# Patient Record
Sex: Female | Born: 1995 | Race: White | Hispanic: No | Marital: Single | Smoking: Never smoker
Health system: Southern US, Community
[De-identification: ages and names within clinical notes are randomized; demographics above are authoritative.]

## PROBLEM LIST (undated history)

## (undated) DIAGNOSIS — J45909 Unspecified asthma, uncomplicated: Secondary | ICD-10-CM

## (undated) DIAGNOSIS — F32A Depression, unspecified: Secondary | ICD-10-CM

## (undated) DIAGNOSIS — F419 Anxiety disorder, unspecified: Secondary | ICD-10-CM

## (undated) DIAGNOSIS — F909 Attention-deficit hyperactivity disorder, unspecified type: Secondary | ICD-10-CM

## (undated) DIAGNOSIS — F329 Major depressive disorder, single episode, unspecified: Secondary | ICD-10-CM

## (undated) HISTORY — PX: WISDOM TOOTH EXTRACTION: SHX21

## (undated) HISTORY — DX: Attention-deficit hyperactivity disorder, unspecified type: F90.9

## (undated) HISTORY — PX: NO PAST SURGERIES: SHX2092

---
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
## 2016-03-05 ENCOUNTER — Ambulatory Visit: Payer: Managed Care, Other (non HMO) | Admitting: Psychiatry

## 2016-04-01 ENCOUNTER — Ambulatory Visit: Payer: Managed Care, Other (non HMO) | Admitting: Psychiatry

## 2016-04-07 ENCOUNTER — Encounter: Payer: Self-pay | Admitting: Psychiatry

## 2016-04-07 ENCOUNTER — Ambulatory Visit (INDEPENDENT_AMBULATORY_CARE_PROVIDER_SITE_OTHER): Payer: Managed Care, Other (non HMO) | Admitting: Psychiatry

## 2016-04-07 VITALS — BP 118/75 | HR 111 | Temp 97.9°F | Ht 64.0 in | Wt 190.4 lb

## 2016-04-07 DIAGNOSIS — F900 Attention-deficit hyperactivity disorder, predominantly inattentive type: Secondary | ICD-10-CM

## 2016-04-07 DIAGNOSIS — F330 Major depressive disorder, recurrent, mild: Secondary | ICD-10-CM | POA: Diagnosis not present

## 2016-04-07 MED ORDER — SERTRALINE HCL 50 MG PO TABS: 75.0000 mg | ORAL_TABLET | Freq: Every day | ORAL | 2 refills | Status: DC

## 2016-04-07 MED ORDER — AMPHETAMINE-DEXTROAMPHETAMINE 10 MG PO TABS: 10.0000 mg | ORAL_TABLET | Freq: Every day | ORAL | 0 refills | Status: DC

## 2016-04-07 NOTE — Progress Notes (Signed)
Patient ID: Susan Zuniga, female   DOB: 1996/01/18, 20 y.o.   MRN: [id number]  Psychiatric Progress Note  Patient Identification: Susan Zuniga MRN:  [id number] Date of Evaluation:  04/07/2016 Chief Complaint:  Anxiety, Depression Chief Complaint    Follow-up; Medication Refill     Visit Diagnosis: Anxiety, Depression  History of Present Illness:  Patient is  [age] yo WF seen for follow up of Anxiety and Depression and ADHD. She reported that she is going back to school and will be starting freshman year at the [employer]. She reported that she is feeling anxious. She reported that she was not taking any medication over the summer as she was working and they want her to have a little ADHD as she is more productive. However she wants to start taking her ADD medication. She responded well to Adderall in the past. She reported that she is sleeping only 2 hours at this time. She appeared somewhat apprehensive and anxious during the interview. She currently denied using any drugs or alcohol. She denied having any suicidal homicidal ideations or plans.     Past Medical History:  Past Medical History:  Diagnosis Date  . ADHD (attention deficit hyperactivity disorder)   . Anxiety   . Asthma   . Depression     Past Surgical History:  Procedure Laterality Date  . NO PAST SURGERIES     Family History:  Family History  Problem Relation Age of Onset  . Healthy Mother   . Healthy Father   . Healthy Sister   . Heart disease Other   . Diabetes Other   . Depression Other   . Hypertension Other   . Asthma Other   . Allergies Other    Social History:   Social History   Social History  . Marital status: Single    Spouse name: N/A  . Number of children: N/A  . Years of education: N/A   Occupational History  . Full Time Student     In the 12th Grade  . Work Part Time    Social History Main Topics  . Smoking status: Current Some Day Smoker    Types: Cigarettes    Start date:  11/01/2015  . Smokeless tobacco: Never Used  . Alcohol use No  . Drug use: No     Comment: last used weeks ago  . Sexual activity: No   Other Topics Concern  . None   Social History Narrative  . None   Additional Social History:   Musculoskeletal: Strength & Muscle Tone: within normal limits Gait & Station: normal Patient leans: N/A  Psychiatric Specialty Exam: HPI  ROS  Blood pressure 118/75, pulse (!) 111, temperature 97.9 F (36.6 C), temperature source Oral, height 5\' 4"  (1.626 m), weight 190 lb 6.4 oz (86.4 kg), last menstrual period 03/24/2016.Body mass index is 32.68 kg/m.  General Appearance: Casual  Eye Contact:  Fair  Speech:  normal  Volume:  normal  Mood:  improved  Affect:  smiling  Thought Process:  Coherent  Orientation:  Full (Time, Place, and Person)  Thought Content:  WDL  Suicidal Thoughts:  No  Homicidal Thoughts:  No  Memory:  Immediate;   Fair Recent;   Fair Remote;   Fair  Judgement:  Fair  Insight:  Present  Psychomotor Activity:  normal  Concentration:  Fair  Recall:  [employer]  Fund of Knowledge:Fair  Language: Fair  Akathisia:  No  Handed:  Right  AIMS (if  indicated):  na  Assets:  Communication Skills Desire for Improvement Financial Resources/Insurance Housing Resilience  ADL's:  Intact  Cognition: WNL  Sleep:  good   Is the patient at risk to self?  No. Has the patient been a risk to self in the past 6 months?  No. Has the patient been a risk to self within the distant past?  Yes.   Is the patient a risk to others?  No. Has the patient been a risk to others in the past 6 months?  No. Has the patient been a risk to others within the distant past?  No.  Allergies:  No Known Allergies Current Medications: Current Outpatient Prescriptions  Medication Sig Dispense Refill  . albuterol (PROVENTIL) (2.5 MG/3ML) 0.083% nebulizer solution Take 2.5 mg by nebulization every 6 (six) hours as needed for wheezing or shortness of breath.     . amphetamine-dextroamphetamine (ADDERALL) 10 MG tablet Take 1 tablet (10 mg total) by mouth daily. To be filled 05/07/16 30 tablet 0  . sertraline (ZOLOFT) 50 MG tablet Take 1.5 tablets (75 mg total) by mouth daily. 45 tablet 2   No current facility-administered medications for this visit.     Previous Psychotropic Medications: Yes   Substance Abuse History in the last 12 months:  No.  Consequences of Substance Abuse: Negative  Medical Decision Making:  Established Problem, Stable/Improving (1), Review of Psycho-Social Stressors (1), Review or order clinical lab tests (1), Review and summation of old records (2) and Review of New Medication or Change in Dosage (2)  Treatment Plan Summary: Medication management   Major depressive disorder I will increase Zoloft at 75mg  once daily to help with the depression and anxiety    ADD Continue Adderall at 10mg  po qam. she was given 2 month supply of the medication  Follow-up in 2 months with Dr. Daleen Boavi.    More than 50% of the time spent in psychoeducation, counseling and coordination of care.    This note was generated in part or whole with voice recognition software. Voice regonition is usually quite accurate but there are transcription errors that can and very often do occur. I apologize for any typographical errors that were not detected and corrected.     Brandy HaleUzma Taeko Schaffer, MD 8/14/20178:53 AM
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
## 2016-06-19 ENCOUNTER — Telehealth: Payer: Self-pay

## 2016-06-19 ENCOUNTER — Other Ambulatory Visit: Payer: Self-pay

## 2016-06-19 ENCOUNTER — Other Ambulatory Visit (HOSPITAL_COMMUNITY): Payer: Self-pay | Admitting: Psychiatry

## 2016-06-19 MED ORDER — AMPHETAMINE-DEXTROAMPHETAMINE 10 MG PO TABS: 10.0000 mg | ORAL_TABLET | Freq: Every day | ORAL | 0 refills | Status: DC

## 2016-06-19 NOTE — Telephone Encounter (Signed)
rx ready for pick up.  adderall id [id number] order # [id number]  and the other rx is [id number] order # [id number]

## 2016-06-19 NOTE — Telephone Encounter (Signed)
pt mom states that pt needs a refill on adderall for a couple of months. pt is in collage 3 hours away and she is trying to get set up with someone at school to take over her care but it is a waiting period.  she would love it if you would write for this month and next month.

## 2016-07-16 ENCOUNTER — Ambulatory Visit: Payer: Self-pay | Admitting: Psychiatry

## 2016-07-16 ENCOUNTER — Ambulatory Visit: Payer: Self-pay | Admitting: Physician Assistant

## 2016-07-29 NOTE — Telephone Encounter (Signed)
Opened in error

## 2016-08-07 ENCOUNTER — Encounter: Payer: Self-pay | Admitting: Psychiatry

## 2016-08-07 ENCOUNTER — Ambulatory Visit (INDEPENDENT_AMBULATORY_CARE_PROVIDER_SITE_OTHER): Payer: Managed Care, Other (non HMO) | Admitting: Psychiatry

## 2016-08-07 VITALS — BP 120/72 | HR 104 | Temp 98.4°F | Wt 195.4 lb

## 2016-08-07 DIAGNOSIS — F900 Attention-deficit hyperactivity disorder, predominantly inattentive type: Secondary | ICD-10-CM

## 2016-08-07 DIAGNOSIS — F411 Generalized anxiety disorder: Secondary | ICD-10-CM | POA: Diagnosis not present

## 2016-08-07 MED ORDER — AMPHETAMINE-DEXTROAMPHETAMINE 10 MG PO TABS: 10.0000 mg | ORAL_TABLET | Freq: Every day | ORAL | 0 refills | Status: DC

## 2016-08-07 MED ORDER — SERTRALINE HCL 25 MG PO TABS: 25.0000 mg | ORAL_TABLET | Freq: Every day | ORAL | 1 refills | Status: DC

## 2016-08-07 NOTE — Progress Notes (Signed)
Patient ID: Susan Zuniga, female   DOB: 1996/01/25, 20 y.o.   MRN: [id number]  Psychiatric Progress Note  Patient Identification: Susan Zuniga MRN:  [id number] Date of Evaluation:  08/07/2016 Chief Complaint:  Anxiety, Depression Chief Complaint    Follow-up; Medication Refill     Visit Diagnosis: Anxiety, Depression  History of Present Illness:  Patient is  [age] yo WF seen for follow up of Anxiety and Depression and ADHD. Patient seen after 4 months, states being in school made it difficult for her to come. She has not been taking the zoloft for 2 months. States she ran out. She has been taking the adderall. States having some anxiety recently. She reports doing well at school. States that she is making good grades and is in the band and also works pretty much everyday. Denies any suicidal thoughts.   Past Medical History:  Past Medical History:  Diagnosis Date  . ADHD (attention deficit hyperactivity disorder)   . Anxiety   . Asthma   . Depression     Past Surgical History:  Procedure Laterality Date  . NO PAST SURGERIES     Family History:  Family History  Problem Relation Age of Onset  . Healthy Mother   . Healthy Father   . Healthy Sister   . Heart disease Other   . Diabetes Other   . Depression Other   . Hypertension Other   . Asthma Other   . Allergies Other    Social History:   Social History   Social History  . Marital status: Single    Spouse name: N/A  . Number of children: N/A  . Years of education: N/A   Occupational History  . Full Time Student     In the 12th Grade  . Work Part Time    Social History Main Topics  . Smoking status: Current Some Day Smoker    Types: Cigarettes    Start date: 11/01/2015  . Smokeless tobacco: Never Used  . Alcohol use No  . Drug use: No     Comment: last used about a month ago  . Sexual activity: No   Other Topics Concern  . None   Social History Narrative  . None   Additional Social History:    Musculoskeletal: Strength & Muscle Tone: within normal limits Gait & Station: normal Patient leans: N/A  Psychiatric Specialty Exam: Medication Refill     ROS  Blood pressure 120/72, pulse (!) 104, temperature 98.4 F (36.9 C), temperature source Oral, weight 195 lb 6.4 oz (88.6 kg).Body mass index is 33.54 kg/m.  General Appearance: Casual  Eye Contact:  Fair  Speech:  normal  Volume:  normal  Mood:  fair  Affect:  smiling  Thought Process:  Coherent  Orientation:  Full (Time, Place, and Person)  Thought Content:  WDL  Suicidal Thoughts:  No  Homicidal Thoughts:  No  Memory:  Immediate;   Fair Recent;   Fair Remote;   Fair  Judgement:  Fair  Insight:  Present  Psychomotor Activity:  normal  Concentration:  Fair  Recall:  F[employer]Fair  Fund of Knowledge:Fair  Language: Fair  Akathisia:  No  Handed:  Right  AIMS (if indicated):  na  Assets:  Communication Skills Desire for Improvement Financial Resources/Insurance Housing Resilience  ADL's:  Intact  Cognition: WNL  Sleep:  good   Is the patient at risk to self?  No. Has the patient been a risk to self in the  past 6 months?  No. Has the patient been a risk to self within the distant past?  Yes.   Is the patient a risk to others?  No. Has the patient been a risk to others in the past 6 months?  No. Has the patient been a risk to others within the distant past?  No.  Allergies:  No Known Allergies Current Medications: Current Outpatient Prescriptions  Medication Sig Dispense Refill  . albuterol (PROVENTIL) (2.5 MG/3ML) 0.083% nebulizer solution Take 2.5 mg by nebulization every 6 (six) hours as needed for wheezing or shortness of breath.    . amphetamine-dextroamphetamine (ADDERALL) 10 MG tablet Take 1 tablet (10 mg total) by mouth daily. To be filled 30 tablet 0  . sertraline (ZOLOFT) 50 MG tablet Take 1.5 tablets (75 mg total) by mouth daily. 45 tablet 2   No current facility-administered medications for this  visit.     Previous Psychotropic Medications: Yes   Substance Abuse History in the last 12 months:  No.  Consequences of Substance Abuse: Negative  Medical Decision Making:  Established Problem, Stable/Improving (1), Review of Psycho-Social Stressors (1), Review or order clinical lab tests (1), Review and summation of old records (2) and Review of New Medication or Change in Dosage (2)  Treatment Plan Summary: Medication management   Major depressive disorder Restart zoloft at 25mg  po qd. patient educated about not stopping the Zoloft suddenly.  ADD Continue Adderall at 10mg  po qam. she was given 2 month supply of the medication  Follow-up in 2 months or call before with the questions   More than 50% of the time spent in psychoeducation, counseling and coordination of care.    This note was generated in part or whole with voice recognition software. Voice regonition is usually quite accurate but there are transcription errors that can and very often do occur. I apologize for any typographical errors that were not detected and corrected.     Patrick NorthAVI, Wilfred Siverson, MD 12/14/20171:33 PM

## 2017-03-10 ENCOUNTER — Encounter: Payer: Self-pay | Admitting: Physician Assistant

## 2017-03-10 ENCOUNTER — Ambulatory Visit (INDEPENDENT_AMBULATORY_CARE_PROVIDER_SITE_OTHER): Payer: Managed Care, Other (non HMO) | Admitting: Physician Assistant

## 2017-03-10 VITALS — BP 110/64 | HR 110 | Resp 16 | Wt 182.0 lb

## 2017-03-10 DIAGNOSIS — Z3009 Encounter for other general counseling and advice on contraception: Secondary | ICD-10-CM | POA: Diagnosis not present

## 2017-03-10 NOTE — Progress Notes (Signed)
       Patient: Susan Zuniga Female    DOB: 1996/07/14   21 y.o.   MRN: [id number] Visit Date: 03/10/2017  Today's Provider: Trey SailorsAdriana M Pollak, PA-C   Chief Complaint  Patient presents with  . Contraception   Subjective:    HPI  Susan Zuniga is a 21 y/o woman presenting today to talk about birth control options. She has been on Jolivette and Sprintec in the past, but desires something that is not pill form because she forgets to take pills. She is not currently sexually active but thinks she may be in several months. Not planning on having children for the next 5-7 years. She does not smoke, no history of DVTs, stroke, heart attack. Would also like birth control that causes her to lose her period.    No Known Allergies   Current Outpatient Prescriptions:  .  amphetamine-dextroamphetamine (ADDERALL) 20 MG tablet, Take 20 mg by mouth 3 (three) times daily., Disp: , Rfl:  .  fexofenadine (ALLEGRA) 180 MG tablet, Take 180 mg by mouth daily., Disp: , Rfl:   Review of Systems  Constitutional: Negative.   Respiratory: Negative.   Cardiovascular: Negative.   Gastrointestinal: Negative.   Genitourinary: Negative.   Neurological: Negative for dizziness, light-headedness and headaches.    Social History  Substance Use Topics  . Smoking status: Former Smoker    Types: Cigarettes    Start date: 11/01/2015  . Smokeless tobacco: Never Used  . Alcohol use No   Objective:   LMP 03/02/2017  There were no vitals filed for this visit.   Physical Exam  Constitutional: She is oriented to person, place, and time. She appears well-developed and well-nourished.  Cardiovascular: Normal rate.   Pulmonary/Chest: Effort normal.  Abdominal: Soft.  Neurological: She is alert and oriented to person, place, and time.  Skin: Skin is warm and dry.  Psychiatric: She has a normal mood and affect. Her behavior is normal.        Assessment & Plan:     1. Encounter for counseling regarding  contraception  We have talked about different types of contraception today. Patient desires non pill forms and one that would cause her to lose her period. Discussed this would leave us with Depo shot, IUD, or Nexplanon. Have counseled patient that all of these have risks with break through bleeding. Have talked about Depo being one shot Q3 months, risk of weight gain and bone mineral density depletion. Also discussed IUD and Nexplanon, risks associated with both. Patient thinks she would like to pursue Nexplanon. Will refer to gynecology.  - Ambulatory referral to Obstetrics / Gynecology  I have spent 15 minutes with this patient >50% of which was spent on counseling and coordination of care.  Return if symptoms worsen or fail to improve.        Trey SailorsAdriana M Pollak, PA-C  [facility]

## 2017-03-10 NOTE — Patient Instructions (Signed)
Contraception Choices Contraception (birth control) is the use of any methods or devices to prevent pregnancy. Below are some methods to help avoid pregnancy. Hormonal methods  Contraceptive implant. This is a thin, plastic tube containing progesterone hormone. It does not contain estrogen hormone. Your health care provider inserts the tube in the inner part of the upper arm. The tube can remain in place for up to 3 years. After 3 years, the implant must be removed. The implant prevents the ovaries from releasing an egg (ovulation), thickens the cervical mucus to prevent sperm from entering the uterus, and thins the lining of the inside of the uterus.  Progesterone-only injections. These injections are given every 3 months by your health care provider to prevent pregnancy. This synthetic progesterone hormone stops the ovaries from releasing eggs. It also thickens cervical mucus and changes the uterine lining. This makes it harder for sperm to survive in the uterus.  Birth control pills. These pills contain estrogen and progesterone hormone. They work by preventing the ovaries from releasing eggs (ovulation). They also cause the cervical mucus to thicken, preventing the sperm from entering the uterus. Birth control pills are prescribed by a health care provider.Birth control pills can also be used to treat heavy periods.  Minipill. This type of birth control pill contains only the progesterone hormone. They are taken every day of each month and must be prescribed by your health care provider.  Birth control patch. The patch contains hormones similar to those in birth control pills. It must be changed once a week and is prescribed by a health care provider.  Vaginal ring. The ring contains hormones similar to those in birth control pills. It is left in the vagina for 3 weeks, removed for 1 week, and then a new one is put back in place. The patient must be comfortable inserting and removing the ring from  the vagina.A health care provider's prescription is necessary.  Emergency contraception. Emergency contraceptives prevent pregnancy after unprotected sexual intercourse. This pill can be taken right after sex or up to 5 days after unprotected sex. It is most effective the sooner you take the pills after having sexual intercourse. Most emergency contraceptive pills are available without a prescription. Check with your pharmacist. Do not use emergency contraception as your only form of birth control. Barrier methods  Female condom. This is a thin sheath (latex or rubber) that is worn over the penis during sexual intercourse. It can be used with spermicide to increase effectiveness.  Female condom. This is a soft, loose-fitting sheath that is put into the vagina before sexual intercourse.  Diaphragm. This is a soft, latex, dome-shaped barrier that must be fitted by a health care provider. It is inserted into the vagina, along with a spermicidal jelly. It is inserted before intercourse. The diaphragm should be left in the vagina for 6 to 8 hours after intercourse.  Cervical cap. This is a round, soft, latex or plastic cup that fits over the cervix and must be fitted by a health care provider. The cap can be left in place for up to 48 hours after intercourse.  Sponge. This is a soft, circular piece of polyurethane foam. The sponge has spermicide in it. It is inserted into the vagina after wetting it and before sexual intercourse.  Spermicides. These are chemicals that kill or block sperm from entering the cervix and uterus. They come in the form of creams, jellies, suppositories, foam, or tablets. They do not require a prescription. They   are inserted into the vagina with an applicator before having sexual intercourse. The process must be repeated every time you have sexual intercourse. Intrauterine contraception  Intrauterine device (IUD). This is a T-shaped device that is put in a woman's uterus during  a menstrual period to prevent pregnancy. There are 2 types: ? Copper IUD. This type of IUD is wrapped in copper wire and is placed inside the uterus. Copper makes the uterus and fallopian tubes produce a fluid that kills sperm. It can stay in place for 10 years. ? Hormone IUD. This type of IUD contains the hormone progestin (synthetic progesterone). The hormone thickens the cervical mucus and prevents sperm from entering the uterus, and it also thins the uterine lining to prevent implantation of a fertilized egg. The hormone can weaken or kill the sperm that get into the uterus. It can stay in place for 3-5 years, depending on which type of IUD is used. Permanent methods of contraception  Female tubal ligation. This is when the woman's fallopian tubes are surgically sealed, tied, or blocked to prevent the egg from traveling to the uterus.  Hysteroscopic sterilization. This involves placing a small coil or insert into each fallopian tube. Your doctor uses a technique called hysteroscopy to do the procedure. The device causes scar tissue to form. This results in permanent blockage of the fallopian tubes, so the sperm cannot fertilize the egg. It takes about 3 months after the procedure for the tubes to become blocked. You must use another form of birth control for these 3 months.  Female sterilization. This is when the female has the tubes that carry sperm tied off (vasectomy).This blocks sperm from entering the vagina during sexual intercourse. After the procedure, the man can still ejaculate fluid (semen). Natural planning methods  Natural family planning. This is not having sexual intercourse or using a barrier method (condom, diaphragm, cervical cap) on days the woman could become pregnant.  Calendar method. This is keeping track of the length of each menstrual cycle and identifying when you are fertile.  Ovulation method. This is avoiding sexual intercourse during ovulation.  Symptothermal method.  This is avoiding sexual intercourse during ovulation, using a thermometer and ovulation symptoms.  Post-ovulation method. This is timing sexual intercourse after you have ovulated. Regardless of which type or method of contraception you choose, it is important that you use condoms to protect against the transmission of sexually transmitted infections (STIs). Talk with your health care provider about which form of contraception is most appropriate for you. This information is not intended to replace advice given to you by your health care provider. Make sure you discuss any questions you have with your health care provider. Document Released: 08/11/2005 Document Revised: 01/17/2016 Document Reviewed: 02/03/2013 Elsevier Interactive Patient Education  2017 Elsevier Inc[employer]  

## 2017-03-12 ENCOUNTER — Telehealth: Payer: Self-pay | Admitting: Obstetrics & Gynecology

## 2017-03-12 NOTE — Telephone Encounter (Signed)
Pt is being referred by [facility] for counseling regarding contraception. Left voicemail for patient to call back to be schedule

## 2017-03-17 NOTE — Telephone Encounter (Signed)
Pt is schedule 03/19/17 °

## 2017-03-19 ENCOUNTER — Encounter: Payer: Self-pay | Admitting: Obstetrics and Gynecology

## 2017-03-19 ENCOUNTER — Ambulatory Visit (INDEPENDENT_AMBULATORY_CARE_PROVIDER_SITE_OTHER): Payer: Managed Care, Other (non HMO) | Admitting: Obstetrics and Gynecology

## 2017-03-19 VITALS — BP 108/66 | HR 124 | Ht 65.0 in | Wt 180.0 lb

## 2017-03-19 DIAGNOSIS — Z3009 Encounter for other general counseling and advice on contraception: Secondary | ICD-10-CM

## 2017-03-19 DIAGNOSIS — Z30017 Encounter for initial prescription of implantable subdermal contraceptive: Secondary | ICD-10-CM | POA: Diagnosis not present

## 2017-03-19 MED ORDER — ETONOGESTREL 68 MG ~~LOC~~ IMPL: 68.0000 mg | DRUG_IMPLANT | Freq: Once | SUBCUTANEOUS | Status: AC

## 2017-03-19 NOTE — Progress Notes (Signed)
Chief Complaint  Patient presents with  . Contraception    Referred by [facility]    HPI:      [name] is a 21 y.o. G0P0000 who LMP was Patient's last menstrual period was 03/02/2017., [facility], referred by [facility]. Pt did OCPs in the past and forgot to take them regularly. She is interested in a non-daily BC that will also help with the flow of her periods. 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 Menses are monthly, lasting 7 days, med flow with dysmen, somewhat improved with NSAIDs.  She hasn't been sex active since 11/18. No hx of HTN, clotting disorder, lupus.    Past Medical History:  Diagnosis Date  . ADHD (attention deficit hyperactivity disorder)   . Anxiety   . Asthma   . Depression     Past Surgical History:  Procedure Laterality Date  . NO PAST SURGERIES      Family History  Problem Relation Age of Onset  . Healthy Mother   . Healthy Father   . Healthy Sister   . Heart disease Other   . Diabetes Other   . Depression Other   . Hypertension Other   . Asthma Other   . Allergies Other     Social History   Social History  . Marital status: Single    Spouse name: N/A  . Number of children: N/A  . Years of education: N/A   Occupational History  . Full Time Student     In the 12th Grade  . Work Part Time    Social History Main Topics  . Smoking status: Never Smoker  . Smokeless tobacco: Never Used  . Alcohol use No  . Drug use: No     Comment: last used about a month ago  . Sexual activity: No   Other Topics Concern  . Not on file   Social History Narrative  . No narrative on file     Current Outpatient Prescriptions:  .  amphetamine-dextroamphetamine (ADDERALL) 20 MG tablet, Take 20 mg by mouth 3 (three) times daily., Disp: , Rfl:  .  fexofenadine (ALLEGRA) 180 MG tablet, Take 180 mg by mouth daily., Disp: , Rfl:    ROS:  Review of Systems  Constitutional: Negative for fever.    Gastrointestinal: Negative for blood in stool, constipation, diarrhea, nausea and vomiting.  Genitourinary: Negative for dyspareunia, dysuria, flank pain, frequency, hematuria, urgency, vaginal bleeding, vaginal discharge and vaginal pain.  Musculoskeletal: Negative for back pain.  Skin: Negative for rash.     OBJECTIVE:   Vitals:  BP 108/66 (BP Location: Left Arm, Patient Position: Sitting, Cuff Size: Normal)   Pulse (!) 124   Ht 5\' 5"  (1.651 m)   Wt 180 lb (81.6 kg)   LMP 03/02/2017   BMI 29.95 kg/m   Physical Exam  Constitutional: She is oriented to person, place, and time and well-developed, well-nourished, and in no distress.  Neurological: She is alert and oriented to person, place, and time.  Psychiatric: Mood, memory, affect and judgment normal.  Vitals reviewed.    Assessment/Plan: Encounter for initial prescription of implantable subdermal contraceptive - All BC options/pros and cons discussed. Pt elects to try nexplanon. Placed today. F/u prn.      Nexplanon Insertion  Patient given informed consent, signed copy in the chart, time out was performed.  Appropriate time out taken.  Patient's LEFT arm was prepped and  draped in the usual sterile fashion. The ruler used to measure and mark insertion area.  Pt was prepped with betadine swab and then injected with 1.0 cc of 2% lidocaine with epinephrine. Nexplanon removed form packaging,  Device confirmed in needle, then inserted full length of needle and withdrawn per handbook instructions.  Pt insertion site covered with steri-strip and a bandage.   Minimal blood loss.  Pt tolerated the procedure welL.  Assessment: Encounter for initial prescription of implantable subdermal contraceptive - All BC options/pros and cons discussed. Pt elects to try nexplanon. Placed today. F/u prn.    Plan:   She was told to remove the dressing in 12-24 hours, to keep the incision area dry for 24 hours and to remove the Steristrip in 2-3   days.  Notify us if any signs of tenderness, redness, pain, or fevers develop.   Alicia B. Copland, PA-C 03/19/2017 2:41 PM

## 2017-03-19 NOTE — Patient Instructions (Signed)
Remove the dressing in 12-24 hours,  keep the incision area dry for 24 hours and remove the Steristrip in 2-3  days.  Notify us if any signs of tenderness, redness, pain, or fevers develop.  

## 2017-10-21 ENCOUNTER — Encounter: Payer: Self-pay | Admitting: Gynecology

## 2017-10-21 ENCOUNTER — Other Ambulatory Visit: Payer: Self-pay

## 2017-10-21 ENCOUNTER — Ambulatory Visit
Admission: EM | Admit: 2017-10-21 | Discharge: 2017-10-21 | Disposition: A | Discharge status: Home / Self Care | Payer: Managed Care, Other (non HMO) | Attending: Family Medicine | Admitting: Family Medicine

## 2017-10-21 DIAGNOSIS — J0100 Acute maxillary sinusitis, unspecified: Secondary | ICD-10-CM

## 2017-10-21 DIAGNOSIS — H6503 Acute serous otitis media, bilateral: Secondary | ICD-10-CM

## 2017-10-21 MED ORDER — AMOXICILLIN 875 MG PO TABS: 875.0000 mg | ORAL_TABLET | Freq: Two times a day (BID) | ORAL | 0 refills | Status: DC

## 2017-10-21 NOTE — ED Triage Notes (Signed)
Patient c/o cough x6 days.

## 2017-10-21 NOTE — ED Provider Notes (Signed)
[facility]    CSN: [id number] Arrival date & time: 10/21/17  1057     History   Chief Complaint Chief Complaint  Patient presents with  . Cough    HPI Susan Zuniga is a 22 y.o. female.   The history is provided by the patient.  Cough  Associated symptoms: headaches   Associated symptoms: no wheezing   URI  Presenting symptoms: cough, facial pain and fatigue   Severity:  Moderate Onset quality:  Sudden Duration:  1 week Timing:  Constant Progression:  Worsening Chronicity:  New Relieved by:  Nothing Ineffective treatments:  OTC medications Associated symptoms: headaches and sinus pain   Associated symptoms: no wheezing   Risk factors: sick contacts   Risk factors: not elderly, no chronic cardiac disease, no chronic kidney disease, no chronic respiratory disease, no diabetes mellitus, no immunosuppression, no recent illness and no recent travel     Past Medical History:  Diagnosis Date  . ADHD (attention deficit hyperactivity disorder)   . Anxiety   . Asthma   . Depression     Patient Active Problem List   Diagnosis Date Noted  . Anxiety 09/13/2015  . Clinical depression 09/13/2015  . Poor concentration 09/13/2015  . Acne 08/02/2015  . Allergic rhinitis 08/02/2015  . 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 Family planning 08/02/2015  . Asthma, mild persistent 08/02/2015    Past Surgical History:  Procedure Laterality Date  . NO PAST SURGERIES    . WISDOM TOOTH EXTRACTION      OB History    Gravida Para Term Preterm AB Living   0 0 0 0 0 0   SAB TAB Ectopic Multiple Live Births   0 0 0 0 0       Home Medications    Prior to Admission medications   Medication Sig Start Date End Date Taking? Authorizing Provider  amphetamine-dextroamphetamine (ADDERALL) 20 MG tablet Take 20 mg by mouth 3 (three) times daily.   Yes [provider]  escitalopram (LEXAPRO) 10 MG tablet Take 10 mg by mouth daily.   Yes [provider]    fexofenadine (ALLEGRA) 180 MG tablet Take 180 mg by mouth daily.   Yes [provider]  prazosin (MINIPRESS) 1 MG capsule Take 1 mg by mouth at bedtime.   Yes [provider]  amoxicillin (AMOXIL) 875 MG tablet Take 1 tablet (875 mg total) by mouth 2 (two) times daily. 10/21/17   Payton Mccallum, MD    Family History Family History  Problem Relation Age of Onset  . Healthy Mother   . Healthy Father   . Healthy Sister   . Heart disease Other   . Diabetes Other   . Depression Other   . Hypertension Other   . Asthma Other   . Allergies Other     Social History Social History   Tobacco Use  . Smoking status: Never Smoker  . Smokeless tobacco: Never Used  Substance Use Topics  . Alcohol use: No    Alcohol/week: 0.0 oz  . Drug use: No    Comment: last used about a month ago     Allergies   Patient has no known allergies.   Review of Systems Review of Systems  Constitutional: Positive for fatigue.  HENT: Positive for sinus pain.   Respiratory: Positive for cough. Negative for wheezing.   Neurological: Positive for headaches.     Physical Exam Triage Vital Signs ED Triage Vitals  Enc Vitals Group  BP 10/21/17 1126 120/81     Pulse Rate 10/21/17 1126 100     Resp 10/21/17 1126 16     Temp 10/21/17 1126 98.6 F (37 C)     Temp Source 10/21/17 1126 Oral     SpO2 10/21/17 1126 98 %     Weight 10/21/17 1125 180 lb (81.6 kg)     Height 10/21/17 1125 5\' 5"  (1.651 m)     Head Circumference --      Peak Flow --      Pain Score 10/21/17 1125 2     Pain Loc --      Pain Edu? --      Excl. in GC? --    No data found.  Updated Vital Signs BP 120/81 (BP Location: Left Arm)   Pulse 100   Temp 98.6 F (37 C) (Oral)   Resp 16   Ht 5\' 5"  (1.651 m)   Wt 180 lb (81.6 kg)   LMP 10/07/2017   SpO2 98%   BMI 29.95 kg/m   Visual Acuity Right Eye Distance:   Left Eye Distance:   Bilateral Distance:    Right Eye Near:   Left Eye Near:     Bilateral Near:     Physical Exam  Constitutional: She appears well-developed and well-nourished. No distress.  HENT:  Head: Normocephalic and atraumatic.  Right Ear: External ear and ear canal normal. Tympanic membrane is erythematous. A middle ear effusion is present.  Left Ear: External ear and ear canal normal. Tympanic membrane is erythematous. A middle ear effusion is present.  Nose: Mucosal edema and rhinorrhea present. No nose lacerations, sinus tenderness, nasal deformity, septal deviation or nasal septal hematoma. No epistaxis.  No foreign bodies. Right sinus exhibits maxillary sinus tenderness and frontal sinus tenderness. Left sinus exhibits maxillary sinus tenderness and frontal sinus tenderness.  Mouth/Throat: Uvula is midline, oropharynx is clear and moist and mucous membranes are normal. No oropharyngeal exudate.  Eyes: Conjunctivae and EOM are normal. Pupils are equal, round, and reactive to light. Right eye exhibits no discharge. Left eye exhibits no discharge. No scleral icterus.  Neck: Normal range of motion. Neck supple. No thyromegaly present.  Cardiovascular: Normal rate, regular rhythm and normal heart sounds.  Pulmonary/Chest: Effort normal and breath sounds normal. No respiratory distress. She has no wheezes. She has no rales.  Lymphadenopathy:    She has no cervical adenopathy.  Skin: She is not diaphoretic.  Nursing note and vitals reviewed.    UC Treatments / Results  Labs (all labs ordered are listed, but only abnormal results are displayed) Labs Reviewed - No data to display  EKG  EKG Interpretation None       Radiology No results found.  Procedures Procedures (including critical care time)  Medications Ordered in UC Medications - No data to display   Initial Impression / Assessment and Plan / UC Course  I have reviewed the triage vital signs and the nursing notes.  Pertinent labs & imaging results that were available during my care of  the patient were reviewed by me and considered in my medical decision making (see chart for details).      Final Clinical Impressions(s) / UC Diagnoses   Final diagnoses:  Acute maxillary sinusitis, recurrence not specified  Bilateral acute serous otitis media, recurrence not specified    ED Discharge Orders        Ordered    amoxicillin (AMOXIL) 875 MG tablet  2 times daily  10/21/17 1213     1. diagnosis reviewed with patient 2. rx as per orders above; reviewed possible side effects, interactions, risks and benefits  3. Recommend supportive treatment with otc flonase 4. Follow-up prn if symptoms worsen or don't improve  Controlled Substance Prescriptions [place] Controlled Substance Registry consulted? Not Applicable   Payton Mccallum, MD 10/21/17 [phone]

## 2020-02-09 ENCOUNTER — Encounter: Payer: Self-pay | Admitting: Family Medicine

## 2020-02-09 ENCOUNTER — Other Ambulatory Visit: Payer: Self-pay

## 2020-02-09 ENCOUNTER — Ambulatory Visit: Payer: Managed Care, Other (non HMO) | Admitting: Family Medicine

## 2020-02-09 VITALS — BP 120/78 | HR 80 | Ht 65.0 in | Wt 214.0 lb

## 2020-02-09 DIAGNOSIS — Z7689 Persons encountering health services in other specified circumstances: Secondary | ICD-10-CM

## 2020-02-09 DIAGNOSIS — N63 Unspecified lump in unspecified breast: Secondary | ICD-10-CM

## 2020-02-09 DIAGNOSIS — Z3009 Encounter for other general counseling and advice on contraception: Secondary | ICD-10-CM

## 2020-02-09 DIAGNOSIS — J4530 Mild persistent asthma, uncomplicated: Secondary | ICD-10-CM | POA: Diagnosis not present

## 2020-02-09 DIAGNOSIS — F324 Major depressive disorder, single episode, in partial remission: Secondary | ICD-10-CM

## 2020-02-09 DIAGNOSIS — R41840 Attention and concentration deficit: Secondary | ICD-10-CM

## 2020-02-09 MED ORDER — FLUOXETINE HCL 20 MG PO TABS: 20.0000 mg | ORAL_TABLET | Freq: Every day | ORAL | 3 refills | Status: DC

## 2020-02-09 MED ORDER — ALBUTEROL SULFATE HFA 108 (90 BASE) MCG/ACT IN AERS: 2.0000 | INHALATION_SPRAY | Freq: Four times a day (QID) | RESPIRATORY_TRACT | 11 refills | Status: AC | PRN

## 2020-02-09 NOTE — Addendum Note (Signed)
Addended by: Everitt Amber on: 02/09/2020 04:05 PM   Modules accepted: Orders

## 2020-02-09 NOTE — Progress Notes (Signed)
Date:  02/09/2020   Name:  Susan Zuniga   DOB:  May 15, 1996   MRN:  [id number]   Chief Complaint: Establish Care and Breast Mass (right breast, big and firm x1-2 years)  Patient is a 24 year old female who presents for a establish care exam. The patient reports the following problems: breast mass/ med refill/ contrceptive maintenance. Health maintenance has been reviewed cervical cancer screen.  Patient is also noted a breast mass for almost 2 years that she has been following has not had evaluation for.  Patient also needs to have her Nexplanon remove and other options discussed. exerbation exercise asthma. Desires weight loss/refills adhd.   Lab Results  Component Value Date   CREATININE 0.5 03/15/2014   BUN 7 03/15/2014   NA 139 03/15/2014   K 4.5 03/15/2014   No results found for: CHOL, HDL, LDLCALC, LDLDIRECT, TRIG, CHOLHDL Lab Results  Component Value Date   TSH 3.80 03/15/2014   No results found for: HGBA1C Lab Results  Component Value Date   WBC 6.3 03/15/2014   HGB 12.6 03/15/2014   HCT 39 03/15/2014   PLT 244 03/15/2014   Lab Results  Component Value Date   ALT 14 03/15/2014   AST 11 03/15/2014     Review of Systems  Constitutional: Negative.  Negative for chills, fatigue, fever and unexpected weight change.  HENT: Negative for congestion, ear discharge, ear pain, rhinorrhea, sinus pressure, sneezing and sore throat.   Eyes: Negative for photophobia, pain, discharge, redness and itching.  Respiratory: Positive for wheezing. Negative for cough, shortness of breath and stridor.        Exercise related  Cardiovascular: Negative for chest pain, palpitations and leg swelling.  Gastrointestinal: Negative for abdominal pain, blood in stool, constipation, diarrhea, nausea and vomiting.  Endocrine: Negative for cold intolerance, heat intolerance, polydipsia, polyphagia and polyuria.  Genitourinary: Negative for dysuria, flank pain, frequency, hematuria, menstrual  problem, pelvic pain, urgency, vaginal bleeding and vaginal discharge.  Musculoskeletal: Negative for arthralgias, back pain and myalgias.  Skin: Negative for rash.  Allergic/Immunologic: Negative for environmental allergies and food allergies.  Neurological: Negative for dizziness, weakness, light-headedness, numbness and headaches.  Hematological: Negative for adenopathy. Does not bruise/bleed easily.  Psychiatric/Behavioral: Negative for dysphoric mood. The patient is not nervous/anxious.     Patient Active Problem List   Diagnosis Date Noted  . Anxiety 09/13/2015  . Clinical depression 09/13/2015  . Poor concentration 09/13/2015  . Acne 08/02/2015  . Allergic rhinitis 08/02/2015  . 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 Family planning 08/02/2015  . Asthma, mild persistent 08/02/2015    No Known Allergies  Past Surgical History:  Procedure Laterality Date  . NO PAST SURGERIES    . WISDOM TOOTH EXTRACTION      Social History   Tobacco Use  . Smoking status: Never Smoker  . Smokeless tobacco: Never Used  Vaping Use  . Vaping Use: Some days  Substance Use Topics  . Alcohol use: No    Alcohol/week: 0.0 standard drinks    Comment: once a week    . Drug use: No    Comment: last used about a month ago     Medication list has been reviewed and updated.  Current Meds  Medication Sig  . albuterol (VENTOLIN HFA) 108 (90 Base) MCG/ACT inhaler Inhale into the lungs.  . Cetirizine HCl 10 MG CAPS Take 1 capsule by mouth daily.  . Cholecalciferol (VITAMIN D3 PO) Take by mouth daily. 1000  iu  . FLUoxetine (PROZAC) 10 MG capsule Take 10 mg by mouth daily.  Marland Kitchen levOCARNitine (L-CARNITINE PO) Take by mouth daily.  . Melatonin 10 MG TABS Take 1 tablet by mouth.  . Multiple Vitamin (MULTIVITAMIN) capsule Take 1 capsule by mouth daily.   Current Facility-Administered Medications for the 02/09/20 encounter (Office Visit) with Juline Patch, MD  Medication  . etonogestrel (NEXPLANON)  implant 68 mg    PHQ 2/9 Scores 02/09/2020  PHQ - 2 Score 0  PHQ- 9 Score 3    GAD 7 : Generalized Anxiety Score 02/09/2020  Nervous, Anxious, on Edge 0  Control/stop worrying 0  Worry too much - different things 0  Trouble relaxing 0  Restless 0  Easily annoyed or irritable 0  Afraid - awful might happen 0  Total GAD 7 Score 0  Anxiety Difficulty Not difficult at all    BP Readings from Last 3 Encounters:  02/09/20 120/78  10/21/17 120/81  03/19/17 108/66    Physical Exam Vitals and nursing note reviewed.  Constitutional:      General: She is not in acute distress.    Appearance: She is not diaphoretic.  HENT:     Head: Normocephalic and atraumatic.     Right Ear: External ear normal.     Left Ear: External ear normal.     Nose: Nose normal.  Eyes:     General:        Right eye: No discharge.        Left eye: No discharge.     Conjunctiva/sclera: Conjunctivae normal.     Pupils: Pupils are equal, round, and reactive to light.  Neck:     Thyroid: No thyromegaly.     Vascular: No JVD.  Cardiovascular:     Rate and Rhythm: Normal rate and regular rhythm.     Heart sounds: Normal heart sounds. No murmur heard.  No friction rub. No gallop.   Pulmonary:     Effort: Pulmonary effort is normal.     Breath sounds: Normal breath sounds.  Chest:     Breasts:        Right: Mass present.        Comments: 1 cmm fullness/? Mass 1 o'clockouter aspect Abdominal:     General: Bowel sounds are normal.     Palpations: Abdomen is soft. There is no mass.     Tenderness: There is no abdominal tenderness. There is no guarding.  Musculoskeletal:        General: Normal range of motion.     Cervical back: Normal range of motion and neck supple.  Lymphadenopathy:     Cervical: No cervical adenopathy.  Skin:    General: Skin is warm and dry.  Neurological:     Mental Status: She is alert.     Deep Tendon Reflexes: Reflexes are normal and symmetric.     Wt Readings from  Last 3 Encounters:  02/09/20 214 lb (97.1 kg)  10/21/17 180 lb (81.6 kg)  03/19/17 180 lb (81.6 kg) (94 %, Z= 1.59)*   * Growth percentiles are based on CDC (Girls, 2-20 Years) data.    BP 120/78   Pulse 80   Ht 5\' 5"  (1.651 m)   Wt 214 lb (97.1 kg)   BMI 35.61 kg/m   Assessment and Plan: 1. Establishing care with new doctor, encounter for Patient establishing care with new physician. Patient's chart was reviewed with regard of previous encounters, most recent labs, most recent  imaging, and care everywhere. Patient sees [facility]p for GYN which time she hadNexplanon implanted at which at this time that she will would like to have removed because her and her boyfriend are discussing possibly having the family.  2. Major depressive disorder in partial remission, unspecified whether recurrent [facility]) Patient had discussion with telemedicine psychiatry at which time her fluoxetine was increased from 10-20. At this time patient was continued on 20 mg and psychiatry has been consulted for evaluation of depression and possibility of evaluating for ADHD-like symptoms. - FLUoxetine (PROZAC) 20 MG tablet; Take 1 tablet (20 mg total) by mouth daily.  Dispense: 30 tablet; Refill: 3 - Ambulatory referral to Psychiatry  3. Mild persistent asthma, unspecified whether complicated Patient has begun exercising and would like to lose weight she was given a low calorie diet 1200 cal. In the meantime she has gone to the gym and she has had an exacerbation of her asthma. Her albuterol inhaler was refilled as Ventolin 2 puffs every 6 hours as needed. - albuterol (VENTOLIN HFA) 108 (90 Base) MCG/ACT inhaler; Inhale 2 puffs into the lungs every 6 (six) hours as needed for wheezing or shortness of breath.  Dispense: 6.7 g; Refill: 11  4. Family planning Patient is to return to Dr. Dallas Schimke for removal of Nexplanon and discussion of further choices.  5. Poor concentration Patient has a history of poor  concentration for which we have referred to psychiatry for evaluation and perhaps continuance of her Concerta - Ambulatory referral to Psychiatry  6. Breast mass Prominence 2 years has noticed a breast mass in the upper outer quadrant at 1:00 of her right breast. There is a slight fullness and may be some delineation of about 1 cm of a cyst area of fullness/mass. We will obtain an ultrasound for evaluation and if necessary proceed with mammogram. - US BREAST LTD UNI RIGHT INC AXILLA; Future

## 2020-02-09 NOTE — Patient Instructions (Signed)

## 2020-02-10 ENCOUNTER — Telehealth: Payer: Self-pay

## 2020-02-10 NOTE — Telephone Encounter (Signed)
-----   Message from Everitt Amber sent at 02/09/2020  4:39 PM EDT ----- Pt is scheduled for 02/15/20 @ 9:40 at [facility] in [place] for her breast Ultrasound. Lowella Bandy tried to call pt and left a message this afternoon. Please call and document that you got her tomorrow

## 2020-02-10 NOTE — Telephone Encounter (Signed)
Called pt told her that she had an appt for a breast US 02/15/2020 at [facility] in [place]. Pt verbalized understanding.  KP

## 2020-02-15 ENCOUNTER — Ambulatory Visit
Admission: RE | Admit: 2020-02-15 | Discharge: 2020-02-15 | Disposition: A | Discharge status: Home / Self Care | Payer: Managed Care, Other (non HMO) | Source: Ambulatory Visit | Attending: Family Medicine | Admitting: Family Medicine

## 2020-02-15 DIAGNOSIS — N63 Unspecified lump in unspecified breast: Secondary | ICD-10-CM

## 2020-02-17 ENCOUNTER — Other Ambulatory Visit: Payer: Self-pay

## 2020-02-17 ENCOUNTER — Encounter: Payer: Self-pay | Admitting: Family Medicine

## 2020-02-17 ENCOUNTER — Ambulatory Visit (INDEPENDENT_AMBULATORY_CARE_PROVIDER_SITE_OTHER): Payer: Managed Care, Other (non HMO) | Admitting: Family Medicine

## 2020-02-17 VITALS — BP 120/88 | HR 103 | Temp 98.4°F | Ht 65.0 in | Wt 213.2 lb

## 2020-02-17 DIAGNOSIS — Z020 Encounter for examination for admission to educational institution: Secondary | ICD-10-CM | POA: Diagnosis not present

## 2020-02-17 DIAGNOSIS — R000 Tachycardia, unspecified: Secondary | ICD-10-CM | POA: Diagnosis not present

## 2020-02-17 NOTE — Progress Notes (Signed)
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 The patient reports the following problems: none. Health maintenance has been reviewed up to date.   Lab Results  Component Value Date   CREATININE 0.5 03/15/2014   BUN 7 03/15/2014   NA 139 03/15/2014   K 4.5 03/15/2014   No results found for: CHOL, HDL, LDLCALC, LDLDIRECT, TRIG, CHOLHDL Lab Results  Component Value Date   TSH 3.80 03/15/2014   No results found for: HGBA1C Lab Results  Component Value Date   WBC 6.3 03/15/2014   HGB 12.6 03/15/2014   HCT 39 03/15/2014   PLT 244 03/15/2014   Lab Results  Component Value Date   ALT 14 03/15/2014   AST 11 03/15/2014     Review of Systems  Constitutional: Negative for chills and fever.  HENT: Negative for drooling, ear discharge, ear pain and sore throat.   Respiratory: Negative for cough, shortness of breath and wheezing.   Cardiovascular: Negative for chest pain, palpitations and leg swelling.  Gastrointestinal: Negative for abdominal pain, blood in stool, constipation, diarrhea and nausea.  Endocrine: Negative for polydipsia.  Genitourinary: Negative for dysuria, frequency, hematuria and urgency.  Musculoskeletal: Negative for back pain, myalgias and neck pain.  Skin: Negative for rash.  Allergic/Immunologic: Negative for environmental allergies.  Neurological: Negative for dizziness and headaches.  Hematological: Does not bruise/bleed easily.  Psychiatric/Behavioral: Negative for suicidal ideas. The patient is not nervous/anxious.   All other systems reviewed and are negative.   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  Current Meds  Medication Sig   albuterol (VENTOLIN HFA) 108 (90 Base) MCG/ACT inhaler Inhale 2 puffs into the lungs every 6 (six) hours as needed for wheezing or shortness of breath.   Cetirizine HCl 10 MG CAPS Take 1 capsule by mouth daily.   Cholecalciferol (VITAMIN D3 PO) Take by mouth daily. 1000 iu   FLUoxetine (PROZAC) 20 MG tablet Take 1 tablet (20 mg total) by mouth daily.   levOCARNitine (L-CARNITINE PO) Take by mouth daily.   Melatonin 10 MG TABS Take 1 tablet by mouth.   Multiple Vitamin (MULTIVITAMIN) capsule Take 1 capsule by mouth daily.   Current Facility-Administered Medications for the 02/17/20 encounter (Office Visit) with Juline Patch, MD  Medication   etonogestrel (NEXPLANON) implant 68 mg    PHQ 2/9 Scores 02/17/2020 02/09/2020  PHQ - 2 Score  0 0  PHQ- 9 Score 2 3    GAD 7 : Generalized Anxiety Score 02/17/2020 02/09/2020  Nervous, Anxious, on Edge 0 0  Control/stop worrying 0 0  Worry too much - different things 0 0  Trouble relaxing 1 0  Restless 1 0  Easily annoyed or irritable 0 0  Afraid - awful might happen 0 0  Total GAD 7 Score 2 0  Anxiety Difficulty Not difficult at all Not difficult at all    BP Readings from Last 3 Encounters:  02/17/20 120/88  02/09/20 120/78  10/21/17 120/81    Physical Exam Vitals and nursing note reviewed.  Constitutional:       Appearance: She is well-developed.  HENT:     Head: Normocephalic.     Right Ear: External ear normal.     Left Ear: External ear normal.  Eyes:     General: Lids are everted, no foreign bodies appreciated. No scleral icterus.       Left eye: No foreign body or hordeolum.     Conjunctiva/sclera: Conjunctivae normal.     Right eye: Right conjunctiva is not injected.     Left eye: Left conjunctiva is not injected.     Pupils: Pupils are equal, round, and reactive to light.  Neck:     Thyroid: No thyromegaly.     Vascular: No JVD.     Trachea: No tracheal deviation.  Cardiovascular:     Rate and Rhythm: Normal rate and regular rhythm.     Heart sounds: Normal heart sounds. No murmur heard.  No friction rub. No gallop.   Pulmonary:     Effort: Pulmonary effort is normal. No respiratory distress.     Breath sounds: Normal breath sounds. No wheezing or rales.  Abdominal:     General: Bowel sounds are normal.     Palpations: Abdomen is soft. There is no mass.     Tenderness: There is no abdominal tenderness. There is no guarding or rebound.  Musculoskeletal:        General: No tenderness. Normal range of motion.     Cervical back: Normal range of motion and neck supple.  Lymphadenopathy:     Cervical: No cervical adenopathy.  Skin:    General: Skin is warm.     Findings: No rash.  Neurological:     Mental Status: She is alert and oriented to person, place, and time.     Cranial Nerves: No cranial nerve deficit.     Deep Tendon Reflexes: Reflexes normal.  Psychiatric:        Mood and Affect: Mood is not anxious or depressed.     Wt Readings from Last 3 Encounters:  02/17/20 213 lb 3.2 oz (96.7 kg)  02/09/20 214 lb (97.1 kg)  10/21/17 180 lb (81.6 kg)    BP 120/88    Pulse (!) 103    Temp 98.4 F (36.9 C) (Oral)    Ht 5\' 5"  (1.651 m)    Wt 213 lb 3.2 oz (96.7 kg)    LMP  (Exact Date) Comment: Nexplanon   SpO2 98%    BMI 35.48 kg/m   Assessment and Plan: 1. Encounter for  school history and physical examination No subjective/objective concerns noted during the history and physical exam.  Patient's previous encounters were reviewed as were the most recent labs, most recent imaging, and care everywhere.Susan Zuniga is a 24 y.o. female who presents today for her Complete Annual Exam. She feels well.  She reports exercising . She reports she is sleeping well. Immunizations are reviewed and recommendations provided.   Age appropriate screening tests are discussed. Counseling given for risk factor reduction interventions.  Patient has no contraindications to physical activity or rigors of the course.  2. Tachycardia Per protocol for application patient had a heart rate over 100 which we did an EKG with the following results:I have reviewed EKG which shows normal sinus rhythm with a rate of 92.  Intervals were noted to be normal as well as there was no evidence of LVH.  Rhythm no ischemic changes such as Q waves, ST-T wave changes, nor R wave progression.. Comparison to previous EKG dated none available - EKG 12-Lead

## 2020-04-10 ENCOUNTER — Ambulatory Visit: Payer: Managed Care, Other (non HMO) | Admitting: Family Medicine

## 2020-06-12 ENCOUNTER — Other Ambulatory Visit: Payer: Self-pay | Admitting: Family Medicine

## 2020-06-12 DIAGNOSIS — F324 Major depressive disorder, single episode, in partial remission: Secondary | ICD-10-CM

## 2020-06-12 NOTE — Telephone Encounter (Signed)
Requested Prescriptions  Pending Prescriptions Disp Refills  . FLUoxetine (PROZAC) 20 MG tablet [Pharmacy Med Name: FLUOXETINE HCL 20 MG TABLET] 30 tablet 3    Sig: TAKE 1 TABLET BY MOUTH EVERY DAY     Psychiatry:  Antidepressants - SSRI Passed - 06/12/2020  1:37 AM      Passed - Completed PHQ-2 or PHQ-9 in the last 360 days.      Passed - Valid encounter within last 6 months    Recent Outpatient Visits          3 months ago Encounter for school history and physical examination   [facility] Duanne Limerick, MD   4 months ago Establishing care with new doctor, encounter for   [facility] Duanne Limerick, MD   3 years ago Encounter for counseling regarding contraception   [facility] Osvaldo Angst M, [place]   4 years ago Clinical depression   [facility] Lorie Phenix, MD   4 years ago Clinical depression   [facility] Lorie Phenix, MD

## 2020-09-04 ENCOUNTER — Other Ambulatory Visit: Payer: Self-pay | Admitting: Family Medicine

## 2020-09-04 DIAGNOSIS — F324 Major depressive disorder, single episode, in partial remission: Secondary | ICD-10-CM

## 2020-09-27 ENCOUNTER — Other Ambulatory Visit: Payer: Self-pay | Admitting: Family Medicine

## 2020-09-27 DIAGNOSIS — F324 Major depressive disorder, single episode, in partial remission: Secondary | ICD-10-CM

## 2020-09-28 ENCOUNTER — Other Ambulatory Visit: Payer: Self-pay | Admitting: Family Medicine

## 2020-09-28 DIAGNOSIS — F324 Major depressive disorder, single episode, in partial remission: Secondary | ICD-10-CM

## 2020-09-28 NOTE — Telephone Encounter (Signed)
Requested medications are due for refill today yes  Requested medications are on the active medication list yes  Last refill 1/11  Last visit 01/2020  Future visit scheduled no  Notes to clinic Has already had a curtesy refill and there is no upcoming appointment scheduled.

## 2020-12-05 ENCOUNTER — Telehealth: Payer: Self-pay

## 2020-12-05 NOTE — Telephone Encounter (Signed)
Called and left patient a VM asking if she could return my call. Need to know when patient last had a pap smear. Also, if she has ever had a hysterectomy or if she has a Web designer.  If she has a OBGYN she needs to call and schedule a pap smear with them. If not, she needs to make a physical with Dr Yetta Barre for physical with PAP if no hysterectomy.  Waiting for call back.
